# Patient Record
Sex: Male | Born: 1990 | Race: Black or African American | Hispanic: No | Marital: Single
Health system: Southern US, Community
[De-identification: ages and names within clinical notes are randomized; demographics above are authoritative.]

---

## 2021-07-05 ENCOUNTER — Emergency Department: Payer: Self-pay

## 2021-07-05 ENCOUNTER — Other Ambulatory Visit: Payer: Self-pay

## 2021-07-05 ENCOUNTER — Emergency Department
Admission: EM | Admit: 2021-07-05 | Discharge: 2021-07-05 | Disposition: A | Discharge status: Home / Self Care | Payer: Self-pay | Attending: Emergency Medicine | Admitting: Emergency Medicine

## 2021-07-05 DIAGNOSIS — R0789 Other chest pain: Secondary | ICD-10-CM | POA: Insufficient documentation

## 2021-07-05 DIAGNOSIS — I10 Essential (primary) hypertension: Secondary | ICD-10-CM | POA: Insufficient documentation

## 2021-07-05 LAB — COMPREHENSIVE METABOLIC PANEL
ALT: 34 U/L (ref 0–44)
AST: 23 U/L (ref 15–41)
Albumin: 4.1 g/dL (ref 3.5–5.0)
Alkaline Phosphatase: 92 U/L (ref 38–126)
Anion gap: 9 (ref 5–15)
BUN: 21 mg/dL — ABNORMAL HIGH (ref 6–20)
CO2: 27 mmol/L (ref 22–32)
Calcium: 8.7 mg/dL — ABNORMAL LOW (ref 8.9–10.3)
Chloride: 105 mmol/L (ref 98–111)
Creatinine, Ser: 1.01 mg/dL (ref 0.61–1.24)
GFR, Estimated: >60 mL/min (ref >60)
Glucose, Bld: 102 mg/dL — ABNORMAL HIGH (ref 70–99)
Potassium: 3.6 mmol/L (ref 3.5–5.1)
Sodium: 141 mmol/L (ref 135–145)
Total Bilirubin: 0.6 mg/dL (ref 0.3–1.2)
Total Protein: 7.2 g/dL (ref 6.5–8.1)

## 2021-07-05 LAB — CBC
HCT: 45.5 % (ref 39.0–52.0)
Hemoglobin: 16.1 g/dL (ref 13.0–17.0)
MCH: 33.5 pg (ref 26.0–34.0)
MCHC: 35.4 g/dL (ref 30.0–36.0)
MCV: 94.8 fL (ref 80.0–100.0)
Platelets: 192 10*3/uL (ref 150–400)
RBC: 4.8 MIL/uL (ref 4.22–5.81)
RDW: 12.3 % (ref 11.5–15.5)
WBC: 6.1 10*3/uL (ref 4.0–10.5)
nRBC: 0 % (ref 0.0–0.2)

## 2021-07-05 LAB — TROPONIN I (HIGH SENSITIVITY)
Troponin I (High Sensitivity): 4 ng/L (ref <18)
Troponin I (High Sensitivity): 6 ng/L (ref <18)

## 2021-07-05 NOTE — Discharge Instructions (Addendum)

## 2021-07-05 NOTE — ED Triage Notes (Signed)
Pt states he woke up with chest pain tonight, denies any cardiac history. States did have some diaphoresis and states fingers and face became numb. Pt does have a hx of anxiety, states was doing some shallow breathing during episode.

## 2021-07-05 NOTE — ED Provider Notes (Signed)
[facility] Emergency Department Provider Note  ____________________________________________  Time seen: Approximately 6:17 AM  I have reviewed the triage vital signs and the nursing notes.   HISTORY  Chief Complaint Chest Pain   HPI Jonathon Weber is a 30 y.o. male with a history of obesity and high blood pressure who presents for evaluation of chest pain.  Patient reports that he woke up just prior to arrival to the emergency room and noticed that he was having a little bit of hard time breathing.  He felt tightness in his chest and broke out in a sweat.  He does report feeling slightly anxious during the episode and was breathing a little too fast.  His then started feeling numbness in his fingers and his face.  The whole episode lasted about 20 minutes and resolved without intervention.  Patient reports that he was recently diagnosed with hypertension and put on hydrochlorothiazide but he often forgets to take it.  He does not know his family history since he is adopted.  He denies any personal history of heart attacks, PE or DVT, recent travel immobilization, leg pain or swelling, hemoptysis or exogenous hormones.  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  He does report a little bit more stress in his life as he recently had a baby 2 weeks ago.  Patient has been [place] for 4 hours chest pain-free.  He does report that yesterday for most of the day he had some indigestion and nausea that has also resolved.  PMH Obesity Hypertension  Allergies Patient has no allergy information on record.  No family history on file. Unknown as patient is adopted  Social History  Smoking - yes Drugs - no  Review of Systems  Constitutional: Negative for fever. Eyes: Negative for visual changes. ENT: Negative for sore throat. Neck: No neck pain  Cardiovascular: Negative for chest pain. + chest  tightness Respiratory: + shortness of breath. Gastrointestinal: Negative for abdominal pain, vomiting or diarrhea. Genitourinary: Negative for dysuria. Musculoskeletal: Negative for back pain. Skin: Negative for rash. Neurological: Negative for headaches, weakness or numbness. Psych: No SI or HI  ____________________________________________   PHYSICAL EXAM:  VITAL SIGNS: ED Triage Vitals  Enc Vitals Group     BP 07/05/21 0231 (!) 173/110     Pulse Rate 07/05/21 0231 98     Resp 07/05/21 0231 20     Temp 07/05/21 0231 98.5 F (36.9 C)     Temp Source 07/05/21 0231 Oral     SpO2 07/05/21 0231 97 %     Weight 07/05/21 0229 280 lb (127 kg)     Height 07/05/21 0229 6' (1.829 m)     Head Circumference --      Peak Flow --      Pain Score 07/05/21 0229 0     Pain Loc --      Pain Edu? --      Excl. in GC? --     Constitutional: Alert and oriented. Well appearing and in no apparent distress. HEENT:      Head: Normocephalic and atraumatic.         Eyes: Conjunctivae are normal. Sclera is non-icteric.       Mouth/Throat: Mucous membranes are moist.       Neck: Supple with no signs of meningismus. Cardiovascular: Regular rate and rhythm. No murmurs, gallops, or rubs. 2+ symmetrical distal pulses are  present in all extremities. No JVD. Respiratory: Normal respiratory effort. Lungs are clear to auscultation bilaterally.  Gastrointestinal: Soft, non tender, and non distended with positive bowel sounds. No rebound or guarding. Genitourinary: No CVA tenderness. Musculoskeletal:  No edema, cyanosis, or erythema of extremities. Neurologic: Normal speech and language. Face is symmetric. Moving all extremities. No gross focal neurologic deficits are appreciated. Skin: Skin is warm, dry and intact. No rash noted. Psychiatric: Mood and affect are normal. Speech and behavior are normal.  ____________________________________________   LABS (all labs ordered are listed, but only abnormal  results are displayed)  Labs Reviewed  COMPREHENSIVE METABOLIC PANEL - Abnormal; Notable for the following components:      Result Value   Glucose, Bld 102 (*)    BUN 21 (*)    Calcium 8.7 (*)    All other components within normal limits  CBC  TROPONIN I (HIGH SENSITIVITY)  TROPONIN I (HIGH SENSITIVITY)   ____________________________________________  EKG  ED ECG REPORT I, Nita Sickle, the attending physician, personally viewed and interpreted this ECG.  Normal sinus rhythm, rate of 88, normal intervals, normal axis, no ST elevations or depressions ____________________________________________  RADIOLOGY  I have personally reviewed the images performed during this visit and I agree with the Radiologist's read.   Interpretation by Radiologist:  DG Chest 2 View  Result Date: 07/05/2021 CLINICAL DATA:  30 year old male who woke with chest pain. Diaphoresis. EXAM: CHEST - 2 VIEW COMPARISON:  None. FINDINGS: Normal cardiac and mediastinal contours. Negative trachea. Normal lung volumes. No pneumothorax or pleural effusion. Both lungs appear clear. Negative visible bowel gas and osseous structures. IMPRESSION: Negative.  No cardiopulmonary abnormality. Electronically Signed   By: Odessa Fleming M.D.   On: 07/05/2021 06:22     ____________________________________________   PROCEDURES  Procedure(s) performed: None Procedures Critical Care performed:  None ____________________________________________   INITIAL IMPRESSION / ASSESSMENT AND PLAN / ED COURSE  30 y.o. male with a history of obesity and high blood pressure who presents for evaluation of chest pain.  Patient with a 20-minute episode of chest tightness associated with shortness of breath and diaphoresis.  Episode happened prior to arrival to the emergency room.  Patient has been here for 4 hours with no further episodes of chest pain.  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  He does report feeling indigestion and nausea throughout the day so possibly GERD/reflux.  He does have elevated blood pressure here although that is in the setting of noncompliance with his antihypertensive medication.  No clinical signs of dissection with no typical symptoms of chest pain radiating through to intrascapular back, no severe hypertension, symmetric bilateral radial pulses, no associated neurologic deficits, no associated abdominal or lower extremity symptoms, no marfanoid features or evidence of underlying connective tissue disorder, and chest x-ray without evidence of mediastinal widening.  EKG and 2 high-sensitivity troponins are negative.  Chest x-ray with no acute findings.  Blood work with no acute findings.  At this time recommended follow-up with primary care doctor.  Did discuss with the patient that if he starts having chest pain at work or with exertion that he would need to see a cardiologist.  We will give him the phone number one of her cardiologist services for that appointment as needed.  In the meantime we did discuss weight loss and exercise to help with his blood pressure and  to decrease his risk of heart attacks.  Recommended return to the emergency room for new or worsening chest pain, dizziness, shortness of breath.       _____________________________________________ Please note:  Patient was evaluated in Emergency Department today for the symptoms described in the history of present illness. Patient was evaluated in the context of the global COVID-19 pandemic, which necessitated consideration that the patient might be at risk for infection with the SARS-CoV-2 virus that causes COVID-19. Institutional protocols and algorithms that pertain to the evaluation of patients at risk for COVID-19 are in a state of rapid change based on information released by regulatory bodies including the CDC and federal and state  organizations. These policies and algorithms were followed during the patient's care in the ED.  Some ED evaluations and interventions may be delayed as a result of limited staffing during the pandemic.   [place] Controlled Substance Database was reviewed by me. ____________________________________________   FINAL CLINICAL IMPRESSION(S) / ED DIAGNOSES   Final diagnoses:  Atypical chest pain  Primary hypertension      NEW MEDICATIONS STARTED DURING THIS VISIT:  ED Discharge Orders     None        Note:  This document was prepared using Dragon voice recognition software and may include unintentional dictation errors.    Nita Sickle, MD 07/05/21 [phone]

## 2021-07-05 NOTE — ED Notes (Signed)
ED Provider at bedside. 

## 2022-10-15 IMAGING — CR DG CHEST 2V
1 series · 2 of 2 positions shown · non-contrast
Comparison: None.

CLINICAL DATA: 30-year-old male who woke with chest pain.
Diaphoresis.

EXAM:
CHEST - 2 VIEW

[Series 1: dg chest 2 view · 0.14mm/px · 2 of 2 slices shown]
[im 1/2]
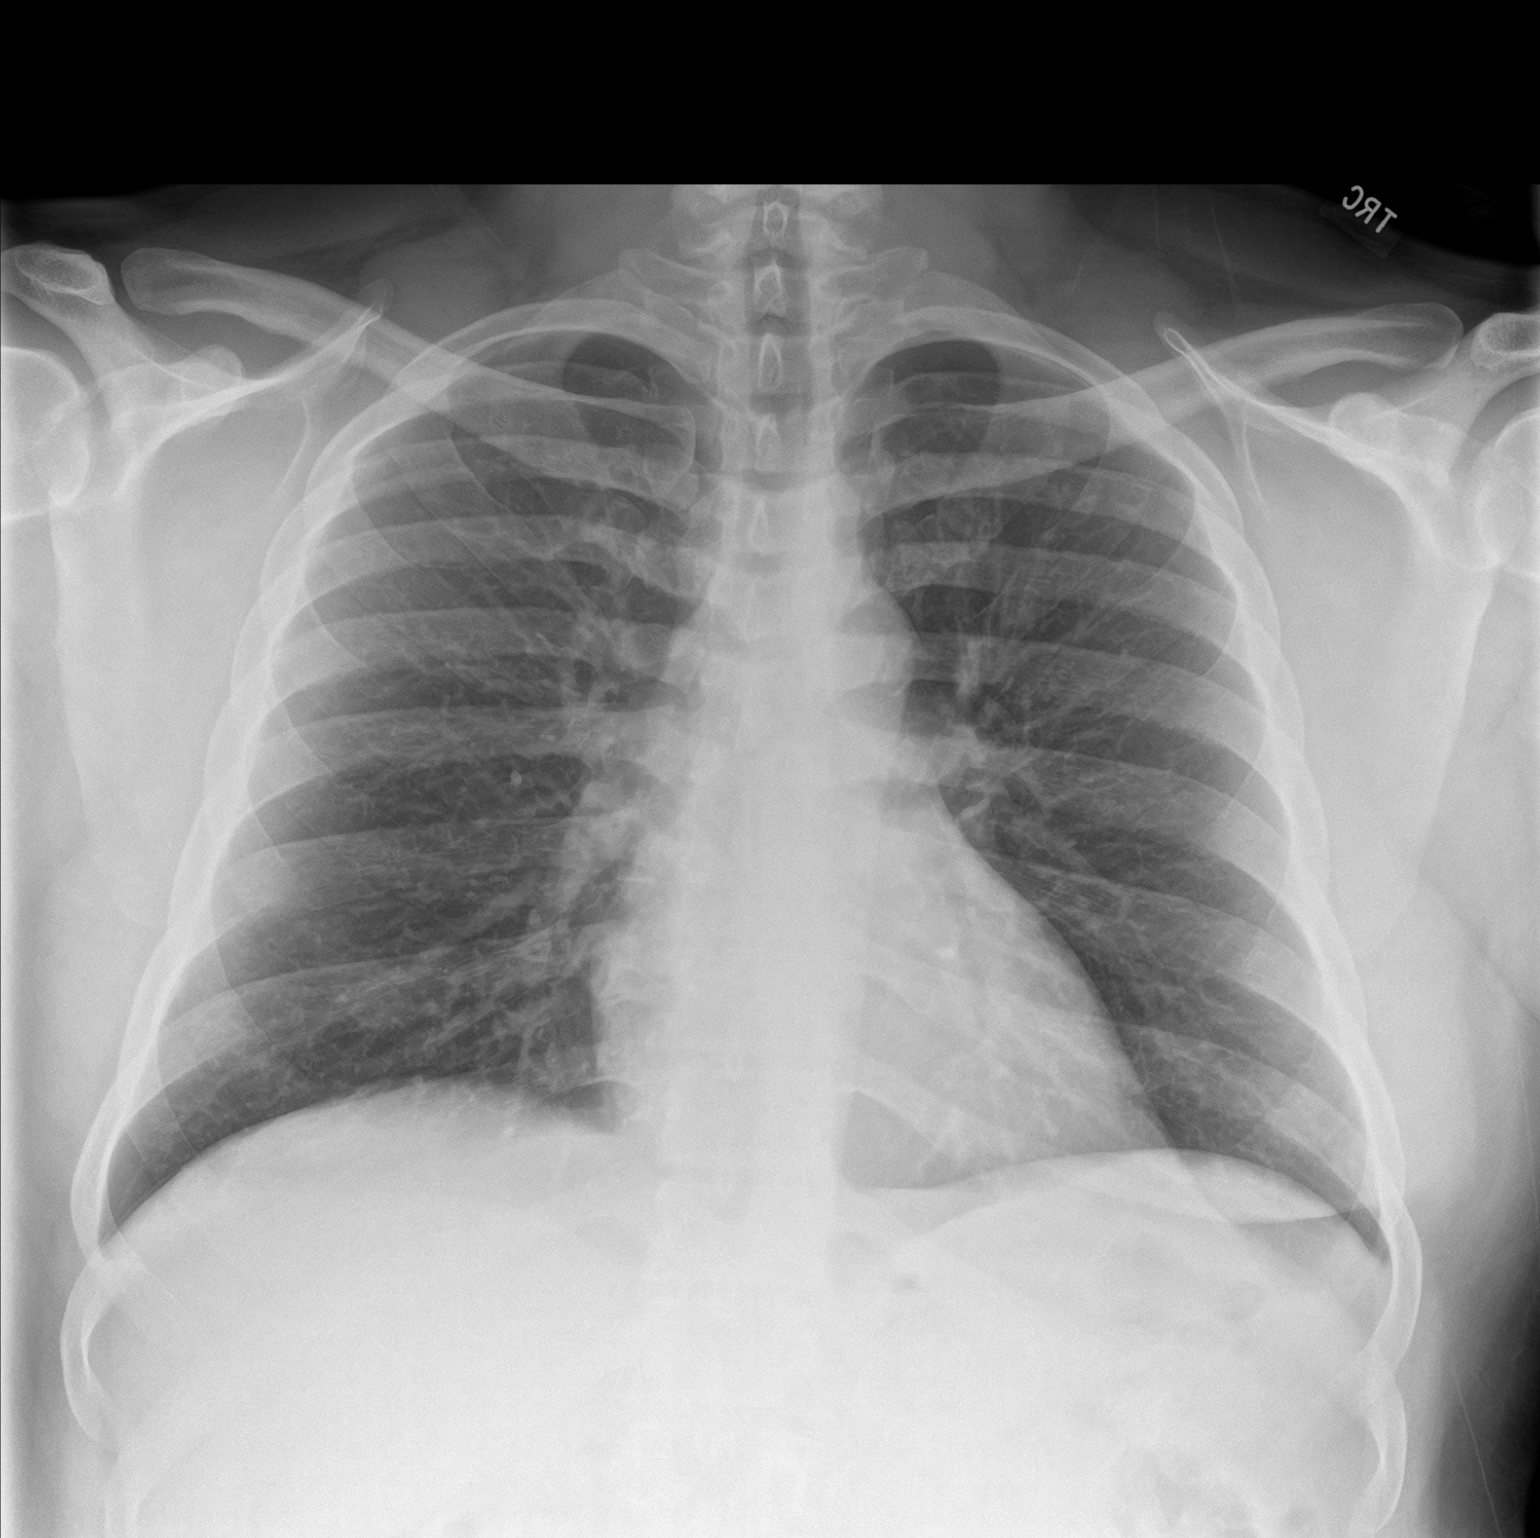
[im 2/2]
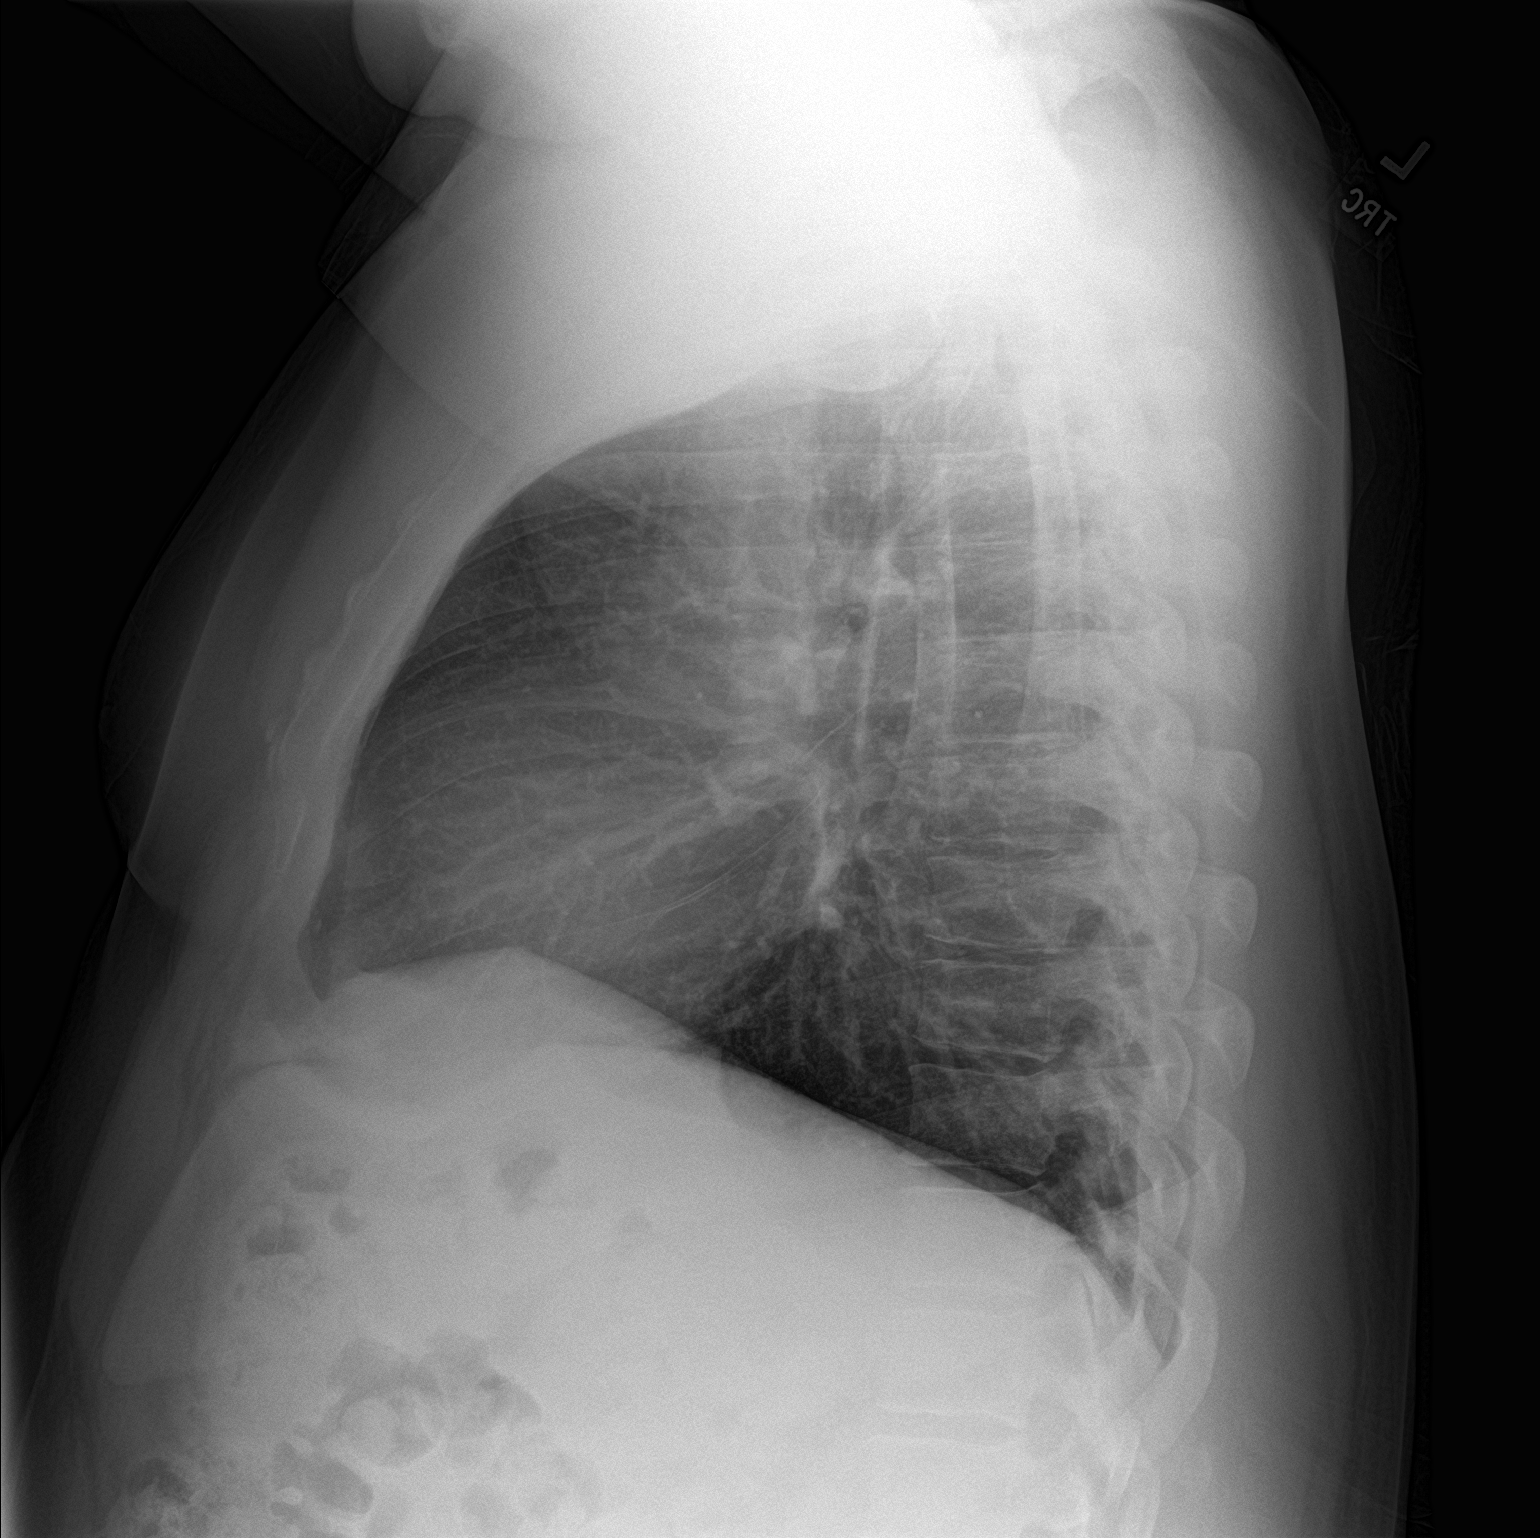

[2 of 2 positions shown; findings below may reference images not displayed]

FINDINGS: Normal cardiac and mediastinal contours. Negative trachea. Normal
lung volumes. No pneumothorax or pleural effusion. Both lungs appear
clear. Negative visible bowel gas and osseous structures.
IMPRESSION: Negative.  No cardiopulmonary abnormality.
